# Patient Record
Sex: Male | Born: 1950 | Race: White | Hispanic: No | Marital: Married | Smoking: Never smoker
Health system: Southern US, Community
[De-identification: ages and names within clinical notes are randomized; demographics above are authoritative.]

## PROBLEM LIST (undated history)

## (undated) DIAGNOSIS — M1990 Unspecified osteoarthritis, unspecified site: Secondary | ICD-10-CM

## (undated) DIAGNOSIS — R011 Cardiac murmur, unspecified: Secondary | ICD-10-CM

## (undated) DIAGNOSIS — K219 Gastro-esophageal reflux disease without esophagitis: Secondary | ICD-10-CM

## (undated) DIAGNOSIS — R519 Headache, unspecified: Secondary | ICD-10-CM

## (undated) HISTORY — PX: COLONOSCOPY: SHX174

## (undated) HISTORY — PX: TONSILLECTOMY: SUR1361

## (undated) HISTORY — PX: APPENDECTOMY: SHX54

## (undated) HISTORY — PX: SMALL BOWEL REPAIR: SHX6447

---

## 2011-12-14 ENCOUNTER — Ambulatory Visit: Payer: Self-pay | Admitting: Physician Assistant

## 2016-03-24 ENCOUNTER — Other Ambulatory Visit: Payer: Self-pay | Admitting: Gastroenterology

## 2016-03-24 DIAGNOSIS — R1310 Dysphagia, unspecified: Secondary | ICD-10-CM

## 2016-03-26 ENCOUNTER — Ambulatory Visit
Admission: RE | Admit: 2016-03-26 | Discharge: 2016-03-26 | Disposition: A | Discharge status: Home / Self Care | Payer: 59 | Source: Ambulatory Visit | Attending: Gastroenterology | Admitting: Gastroenterology

## 2016-03-26 DIAGNOSIS — R1310 Dysphagia, unspecified: Secondary | ICD-10-CM | POA: Diagnosis not present

## 2016-03-26 DIAGNOSIS — K449 Diaphragmatic hernia without obstruction or gangrene: Secondary | ICD-10-CM | POA: Diagnosis not present

## 2021-01-23 ENCOUNTER — Other Ambulatory Visit: Payer: Self-pay | Admitting: Podiatry

## 2021-01-23 ENCOUNTER — Other Ambulatory Visit (HOSPITAL_COMMUNITY): Payer: Self-pay | Admitting: Podiatry

## 2021-01-23 DIAGNOSIS — M722 Plantar fascial fibromatosis: Secondary | ICD-10-CM

## 2021-02-05 ENCOUNTER — Other Ambulatory Visit: Payer: Self-pay

## 2021-02-05 ENCOUNTER — Ambulatory Visit
Admission: RE | Admit: 2021-02-05 | Discharge: 2021-02-05 | Disposition: A | Discharge status: Home / Self Care | Payer: Medicare HMO | Source: Ambulatory Visit | Attending: Podiatry | Admitting: Podiatry

## 2021-02-05 DIAGNOSIS — M722 Plantar fascial fibromatosis: Secondary | ICD-10-CM | POA: Insufficient documentation

## 2021-03-20 ENCOUNTER — Encounter: Payer: Self-pay | Admitting: Podiatry

## 2021-03-28 ENCOUNTER — Other Ambulatory Visit: Payer: Self-pay | Admitting: Podiatry

## 2021-03-29 NOTE — Discharge Instructions (Addendum)
[place]  POST OPERATIVE INSTRUCTIONS FOR DR. Vickki Muff AND DR. Garber   Take your medication as prescribed.  Pain medication should be taken only as needed.  Keep the dressing clean, dry and intact.  Maintain nonweightbearing to the left foot at all times for the next 2 weeks.  Try to keep the boot on at all times as it will be acting as a cast or splint.  If it feels too tight you may loosen it or apply pads to take pressure off tight areas.  Keep your foot elevated above the heart level for the first 48 hours.  Continue elevate thereafter to improve swelling.  May use ice on foot for max 10 minutes out of very 1 hour as needed.  Walking to the bathroom and brief periods of walking are acceptable, unless we have instructed you to be non-weight bearing.  Always wear your post-op shoe when walking.  Always use your crutches, knee scooter, or wheel chair if you are to be non-weight bearing.  Do not take a shower. Baths are permissible as long as the foot is kept out of the water.   Every hour you are awake:  Bend your knee 15 times. Massage calf 15 times  Call [facility] [phone]) if any of the following problems occur: You develop a temperature or fever. The bandage becomes saturated with blood. Medication does not stop your pain. Injury of the foot occurs. Any symptoms of infection including redness, odor, or red streaks running from wound.

## 2021-04-04 ENCOUNTER — Ambulatory Visit
Admission: RE | Admit: 2021-04-04 | Discharge: 2021-04-04 | Disposition: A | Discharge status: Home / Self Care | Payer: Medicare HMO | Attending: Podiatry | Admitting: Podiatry

## 2021-04-04 ENCOUNTER — Encounter
Admission: RE | Disposition: A | Discharge status: Home / Self Care | Payer: Self-pay | Source: Home / Self Care | Attending: Podiatry

## 2021-04-04 ENCOUNTER — Ambulatory Visit: Payer: Medicare HMO | Admitting: Anesthesiology

## 2021-04-04 ENCOUNTER — Other Ambulatory Visit: Payer: Self-pay

## 2021-04-04 ENCOUNTER — Encounter: Payer: Self-pay | Admitting: Podiatry

## 2021-04-04 DIAGNOSIS — M7732 Calcaneal spur, left foot: Secondary | ICD-10-CM | POA: Diagnosis not present

## 2021-04-04 DIAGNOSIS — M722 Plantar fascial fibromatosis: Secondary | ICD-10-CM | POA: Insufficient documentation

## 2021-04-04 DIAGNOSIS — K219 Gastro-esophageal reflux disease without esophagitis: Secondary | ICD-10-CM | POA: Insufficient documentation

## 2021-04-04 DIAGNOSIS — E785 Hyperlipidemia, unspecified: Secondary | ICD-10-CM | POA: Insufficient documentation

## 2021-04-04 DIAGNOSIS — Z09 Encounter for follow-up examination after completed treatment for conditions other than malignant neoplasm: Secondary | ICD-10-CM

## 2021-04-04 DIAGNOSIS — M1990 Unspecified osteoarthritis, unspecified site: Secondary | ICD-10-CM | POA: Insufficient documentation

## 2021-04-04 HISTORY — PX: CALCANEAL OSTEOTOMY: SHX1281

## 2021-04-04 HISTORY — PX: PLANTAR FASCIA RELEASE: SHX2239

## 2021-04-04 HISTORY — DX: Headache, unspecified: R51.9

## 2021-04-04 HISTORY — DX: Gastro-esophageal reflux disease without esophagitis: K21.9

## 2021-04-04 HISTORY — DX: Cardiac murmur, unspecified: R01.1

## 2021-04-04 HISTORY — DX: Unspecified osteoarthritis, unspecified site: M19.90

## 2021-04-04 SURGERY — RELEASE, FASCIA, PLANTAR
Anesthesia: General | Laterality: Left

## 2021-04-04 MED ORDER — BUPIVACAINE HCL 0.5 % IJ SOLN
INTRAMUSCULAR | Status: DC | PRN
  Administered 2021-04-04: 10 mL

## 2021-04-04 MED ORDER — OXYCODONE HCL 5 MG/5ML PO SOLN: 5.0000 mg | Freq: Once | ORAL | Status: DC | PRN

## 2021-04-04 MED ORDER — LIDOCAINE HCL (CARDIAC) PF 100 MG/5ML IV SOSY
PREFILLED_SYRINGE | INTRAVENOUS | Status: DC | PRN
  Administered 2021-04-04: 50 mg via INTRAVENOUS

## 2021-04-04 MED ORDER — ASPIRIN EC 81 MG PO TBEC: 81.0000 mg | DELAYED_RELEASE_TABLET | Freq: Two times a day (BID) | ORAL | 0 refills | Status: AC

## 2021-04-04 MED ORDER — MIDAZOLAM HCL 2 MG/2ML IJ SOLN
INTRAMUSCULAR | Status: DC | PRN
  Administered 2021-04-04: 2 mg via INTRAVENOUS

## 2021-04-04 MED ORDER — ACETAMINOPHEN 160 MG/5ML PO SOLN: 325.0000 mg | ORAL | Status: DC | PRN

## 2021-04-04 MED ORDER — PROPOFOL 500 MG/50ML IV EMUL
INTRAVENOUS | Status: DC | PRN
Start: 2021-04-04 — End: 2021-04-04
  Administered 2021-04-04: 100 ug/kg/min via INTRAVENOUS

## 2021-04-04 MED ORDER — HYDROCODONE-ACETAMINOPHEN 5-325 MG PO TABS: 1.0000 | ORAL_TABLET | Freq: Four times a day (QID) | ORAL | 0 refills | Status: AC | PRN

## 2021-04-04 MED ORDER — LACTATED RINGERS IV SOLN: INTRAVENOUS | Status: DC

## 2021-04-04 MED ORDER — OXYCODONE HCL 5 MG PO TABS: 5.0000 mg | ORAL_TABLET | Freq: Once | ORAL | Status: DC | PRN

## 2021-04-04 MED ORDER — GLYCOPYRROLATE 0.2 MG/ML IJ SOLN
INTRAMUSCULAR | Status: DC | PRN
  Administered 2021-04-04: .2 mg via INTRAVENOUS

## 2021-04-04 MED ORDER — FENTANYL CITRATE PF 50 MCG/ML IJ SOSY: 25.0000 ug | PREFILLED_SYRINGE | INTRAMUSCULAR | Status: DC | PRN

## 2021-04-04 MED ORDER — BUPIVACAINE LIPOSOME 1.3 % IJ SUSP
INTRAMUSCULAR | Status: DC | PRN
  Administered 2021-04-04: 15 mL

## 2021-04-04 MED ORDER — 0.9 % SODIUM CHLORIDE (POUR BTL) OPTIME
TOPICAL | Status: DC | PRN
  Administered 2021-04-04: 1000 mL

## 2021-04-04 MED ORDER — FENTANYL CITRATE (PF) 100 MCG/2ML IJ SOLN
INTRAMUSCULAR | Status: DC | PRN
  Administered 2021-04-04 (×2): 50 ug via INTRAVENOUS

## 2021-04-04 MED ORDER — ONDANSETRON HCL 4 MG/2ML IJ SOLN
INTRAMUSCULAR | Status: DC | PRN
Start: 2021-04-04 — End: 2021-04-04
  Administered 2021-04-04: 4 mg via INTRAVENOUS

## 2021-04-04 MED ORDER — CEFAZOLIN SODIUM-DEXTROSE 2-4 GM/100ML-% IV SOLN
2.0000 g | INTRAVENOUS | Status: AC
  Administered 2021-04-04: 2 g via INTRAVENOUS

## 2021-04-04 MED ORDER — ACETAMINOPHEN 325 MG PO TABS: 325.0000 mg | ORAL_TABLET | ORAL | Status: DC | PRN

## 2021-04-04 SURGICAL SUPPLY — 47 items
BNDG CMPR STD VLCR NS LF 5.8X4 (GAUZE/BANDAGES/DRESSINGS) ×2
BNDG COHESIVE 4X5 TAN ST LF (GAUZE/BANDAGES/DRESSINGS) ×2 IMPLANT
BNDG ELASTIC 4X5.8 VLCR NS LF (GAUZE/BANDAGES/DRESSINGS) ×4 IMPLANT
BNDG ESMARK 4X12 TAN STRL LF (GAUZE/BANDAGES/DRESSINGS) ×2 IMPLANT
BNDG GAUZE ELAST 4 BULKY (GAUZE/BANDAGES/DRESSINGS) ×2 IMPLANT
CANISTER SUCT 1200ML W/VALVE (MISCELLANEOUS) ×2 IMPLANT
COVER LIGHT HANDLE UNIVERSAL (MISCELLANEOUS) ×4 IMPLANT
CUFF TOURN SGL QUICK 18X4 (TOURNIQUET CUFF) IMPLANT
CUFF TOURN SGL QUICK 24 (TOURNIQUET CUFF)
CUFF TRNQT CYL 24X4X16.5-23 (TOURNIQUET CUFF) IMPLANT
DURAPREP 26ML APPLICATOR (WOUND CARE) ×2 IMPLANT
ELECT REM PT RETURN 9FT ADLT (ELECTROSURGICAL) ×2
ELECTRODE REM PT RTRN 9FT ADLT (ELECTROSURGICAL) ×1 IMPLANT
GAUZE 4X4 16PLY ~~LOC~~+RFID DBL (SPONGE) ×1 IMPLANT
GAUZE SPONGE 4X4 12PLY STRL (GAUZE/BANDAGES/DRESSINGS) ×2 IMPLANT
GAUZE XEROFORM 1X8 LF (GAUZE/BANDAGES/DRESSINGS) ×2 IMPLANT
GAUZE XEROFORM 5X9 LF (GAUZE/BANDAGES/DRESSINGS) ×1 IMPLANT
GLOVE SRG 8 PF TXTR STRL LF DI (GLOVE) IMPLANT
GLOVE SURG ENC MOIS LTX SZ7 (GLOVE) ×2 IMPLANT
GLOVE SURG POLYISO LF SZ7 (GLOVE) ×4 IMPLANT
GLOVE SURG UNDER POLY LF SZ7 (GLOVE) ×2 IMPLANT
GLOVE SURG UNDER POLY LF SZ8 (GLOVE) ×4
GOWN STRL REUS W/ TWL LRG LVL3 (GOWN DISPOSABLE) ×2 IMPLANT
GOWN STRL REUS W/TWL LRG LVL3 (GOWN DISPOSABLE) ×4
IV NS 250ML (IV SOLUTION) ×2
IV NS 250ML BAXH (IV SOLUTION) ×1 IMPLANT
IV NS 500ML (IV SOLUTION)
IV NS 500ML BAXH (IV SOLUTION) IMPLANT
K-WIRE DBL END TROCAR 6X.062 (WIRE) ×2
KIT CARPAL TUNNEL (MISCELLANEOUS) ×4
KIT ESCP INSRT D SLOT CANN KN (MISCELLANEOUS) ×1 IMPLANT
KIT PRC PRB RTRGD 3ANG KNF HND (MISCELLANEOUS) ×1 IMPLANT
KIT TURNOVER KIT A (KITS) ×2 IMPLANT
KWIRE DBL END TROCAR 6X.062 (WIRE) IMPLANT
NS IRRIG 500ML POUR BTL (IV SOLUTION) ×2 IMPLANT
PACK EXTREMITY ARMC (MISCELLANEOUS) ×2 IMPLANT
PAD ABD DERMACEA PRESS 5X9 (GAUZE/BANDAGES/DRESSINGS) ×1 IMPLANT
PADDING CAST BLEND 4X4 NS (MISCELLANEOUS) ×6 IMPLANT
SPLINT CAST 1 STEP 4X30 (MISCELLANEOUS) ×2 IMPLANT
STOCKINETTE IMPERVIOUS LG (DRAPES) ×2 IMPLANT
SUT ETHILON 3-0 (SUTURE) ×2 IMPLANT
SUT ETHILON 3-0 FS-10 30 BLK (SUTURE) ×2
SUT VIC AB 3-0 SH 27 (SUTURE)
SUT VIC AB 3-0 SH 27X BRD (SUTURE) IMPLANT
SUTURE EHLN 3-0 FS-10 30 BLK (SUTURE) IMPLANT
WAND TENDON TOPAZ 0 ANGL (MISCELLANEOUS) IMPLANT
WAND TOPAZ MICRO DEBRIDER (MISCELLANEOUS) ×1 IMPLANT

## 2021-04-04 NOTE — Transfer of Care (Signed)
Immediate Anesthesia Transfer of Care Note  Patient: Billy Tran  Procedure(s) Performed: ENDOSCOPIC PLANTAR FASCIA RELEASE (Left) PARTIAL CALCANECTOMY (Left)  Patient Location: PACU  Anesthesia Type: General  Level of Consciousness: awake, alert  and patient cooperative  Airway and Oxygen Therapy: Patient Spontanous Breathing and Patient connected to supplemental oxygen  Post-op Assessment: Post-op Vital signs reviewed, Patient's Cardiovascular Status Stable, Respiratory Function Stable, Patent Airway and No signs of Nausea or vomiting  Post-op Vital Signs: Reviewed and stable  Complications: No notable events documented.

## 2021-04-04 NOTE — Op Note (Signed)
PODIATRY / FOOT AND ANKLE SURGERY OPERATIVE REPORT    SURGEON: Caroline More, DPM  PRE-OPERATIVE DIAGNOSIS:  1.  Left plantar fasciitis with plantar heel spur  POST-OPERATIVE DIAGNOSIS: Same  PROCEDURE(S): Left endoscopic plantar fasciotomy  Left Topaz micro debridement of plantar fascia Left resection of plantar calcaneal heel spur  HEMOSTASIS: Left ankle tourniquet  ANESTHESIA: MAC  ESTIMATED BLOOD LOSS: 10 cc  FINDING(S): 1.  Large plantar heel spur left foot at the origin of plantar fascia 2.  Increased thickness to the plantar fascial left foot  PATHOLOGY/SPECIMEN(S): None  INDICATIONS:   Billy Tran is a 71 y.o. male who presents with chronic pain to the left heel plantarly.  Patient had x-rays taken which also showed a plantar heel spur.  Patient subsequently underwent conservative care consisting of change in shoe gear, orthoses, steroid injections, partial weightbearing techniques with booting, strapping, taping, padding, oral anti-inflammatory medications along with physical therapy types of exercises but still continued to have pain discomfort.  An MRI was taken which showed increased thickness of the plantar fascial as well as a small plantar heel spur with some mild marrow edema at the level of the heel spur.  Discussed all treatment options with patient both conservative and surgical attempts at correction include potential risks and complications at this time patient has elected for surgical intervention consisting of left endoscopic plantar fasciotomy with Topaz micro debridement and plantar calcaneal heel spur resection.  No guarantees given.  Consent obtained and placed in patient's chart.  DESCRIPTION: After obtaining full informed written consent, the patient was brought back to the operating room and placed supine upon the operating table.  The patient received IV antibiotics prior to induction.  After obtaining adequate anesthesia, the patient was prepped and  draped in the standard fashion.  A preoperative block was performed 10 cc of half percent Marcaine plain about the medial and lateral heel as well as tibial nerve distribution around the tarsal tunnel.  An Esmarch bandage was used to exsanguinate the left lower extremity and the pneumatic ankle tourniquet was inflated.  Attention was directed to the left medial heel where a linear longitudinal incision was made slightly distal to the origin point of the plantar fascia parallel to the plantar fascia parallel to the long axis of the foot.  The incision was deepened to the subcutaneous tissues utilizing sharp and blunt dissection care was taken to identify and retract all vital neurovascular structures no venous contributories were cauterized as necessary.  Blunt probing was then performed in the fatty tissue at the plantar heel/arch area creating a pocket between the subcutaneous fat and the plantar fascia.  Once this interval was obtained and obturator and cannula was then placed into the area with the appropriate orientation from medial to lateral.  A small percutaneous incision was made at the lateral heel to past the obturator and cannula through the lateral heel.  The obturator was removed and cannula was left in place.  At this time cotton tip applicators were placed through the cannula to clean out the surgical field.  The scope was then introduced further through the lateral heel incision directed from lateral to medial.  The hook blade was then held external to the foot and approximately 1/2-2/3 of the plantar fascia was measured out of the central band and all the medial band was measured out on the hook blade.  At this time the hook blade was then introduced through the medial heel incision under the scope.  The central band and medial band of the plantar fascia was released such that 2/3-1/2 of the central band was released and all the medial band.  Once this was performed the flexor digitorum brevis  muscle belly was able to be identified indicating successful release.  The foot was held in a maximally dorsiflexed position along with a hallux maximally dorsiflexed to increase the ease of passing of the instrumentation.  A flush was performed with copious amounts normal sterile saline.  The obturator was then placed back in and the obturator and cannula was removed.  At this time under fluoroscopic guidance blunt dissection was performed to the plantar heel spur.  Once the plantar heel spur was identified the bone rasp was then placed through the medial heel incision directly on the heel spur and the plantar medial heel spur was resected under fluoroscopic guidance.  A flush was performed with copious amounts normal sterile saline.  Final C-arm imaging was then taken showing resection of the plantar heel spur.  The medial and lateral heel incisions were then reapproximated well coapted with 3-0 nylon in a combination of simple and horizontal mattress type stitching.  Attention was then directed to the plantar aspect of the heel where a 5 x 5 grid was made on the plantar surface of the heel around the origin of the plantar fascia and on the plantar surface of the heel.  A 0.062 K wire was then used to puncture the small grade holes.  The Topaz micro debridement instrument was then passed into each hole and directed in 3 separate directions to microdebrider the plantar fascial further.  An additional 15 cc of Exparel was injected about the operative sites.  The pneumatic ankle tourniquet was released and a prompt hyperemic response was noted all digits left foot.  A postoperative dressing was then applied consisting of Xeroform followed by 4 x 4 gauze, ABD, Kerlix, Ace wrap, cam boot tall.  Patient tolerated the procedure and anesthesia well was transferred to recovery room vital signs stable vascular status intact all toes left foot.  Patient be discharged home with the appropriate orders and follow-up  instructions as well as medications.  Patient is to remain nonweightbearing to the left foot at all times for the next 2 weeks.  Discussed with family postoperatively.  Patient to follow-up 1 week after surgical date next week.  COMPLICATIONS: None  CONDITION: Good, stable  Caroline More, DPM

## 2021-04-04 NOTE — H&P (Signed)
HISTORY AND PHYSICAL INTERVAL NOTE:  04/04/2021  9:30 AM  Billy Tran  has presented today for surgery, with the diagnosis of M72.2 - Plantar fasciitis, left M77.32 - Calcaneal spur, left M79.672 - Acute foot pain, left.  The various methods of treatment have been discussed with the patient.  No guarantees were given.  After consideration of risks, benefits and other options for treatment, the patient has consented to surgery.  I have reviewed the patients chart and labs.    PROCEDURE: ALL LEFT FOOT Left endoscopic plantar fasciotomy Left topaz microdebridement of plantar fascia Left resection of heel spur  A history and physical examination was performed in my office.  The patient was reexamined.  There have been no changes to this history and physical examination.  Rosetta Posner, DPM

## 2021-04-04 NOTE — Anesthesia Procedure Notes (Signed)
Procedure Name: General with mask airway Date/Time: 04/04/2021 10:42 AM Performed by: Jinny Blossom, CRNA Pre-anesthesia Checklist: Timeout performed, Patient being monitored, Suction available, Emergency Drugs available and Patient identified Patient Re-evaluated:Patient Re-evaluated prior to induction Oxygen Delivery Method: Simple face mask Preoxygenation: Pre-oxygenation with 100% oxygen

## 2021-04-04 NOTE — Anesthesia Postprocedure Evaluation (Signed)
Anesthesia Post Note  Patient: Billy Tran  Procedure(s) Performed: ENDOSCOPIC PLANTAR FASCIA RELEASE (Left) PARTIAL CALCANECTOMY (Left)     Patient location during evaluation: PACU Anesthesia Type: General Level of consciousness: awake Pain management: pain level controlled Vital Signs Assessment: post-procedure vital signs reviewed and stable Respiratory status: respiratory function stable Cardiovascular status: stable Postop Assessment: no signs of nausea or vomiting Anesthetic complications: no   No notable events documented.  Jola Babinski

## 2021-04-04 NOTE — Anesthesia Preprocedure Evaluation (Signed)
Anesthesia Evaluation  Patient identified by MRN, date of birth, ID band Patient awake    Reviewed: Allergy & Precautions, NPO status   Airway Mallampati: II  TM Distance: >3 FB     Dental   Pulmonary    Pulmonary exam normal        Cardiovascular  Rhythm:Regular Rate:Normal  HLD   Neuro/Psych  Headaches,    GI/Hepatic GERD  ,  Endo/Other    Renal/GU      Musculoskeletal  (+) Arthritis ,   Abdominal   Peds  Hematology   Anesthesia Other Findings   Reproductive/Obstetrics                             Anesthesia Physical Anesthesia Plan  ASA: 2  Anesthesia Plan: General   Post-op Pain Management:    Induction: Intravenous  PONV Risk Score and Plan: 2 and Propofol infusion, TIVA and Treatment may vary due to age or medical condition  Airway Management Planned: Natural Airway and Nasal Cannula  Additional Equipment:   Intra-op Plan:   Post-operative Plan:   Informed Consent: I have reviewed the patients History and Physical, chart, labs and discussed the procedure including the risks, benefits and alternatives for the proposed anesthesia with the patient or authorized representative who has indicated his/her understanding and acceptance.       Plan Discussed with: CRNA  Anesthesia Plan Comments:         Anesthesia Quick Evaluation

## 2021-04-05 ENCOUNTER — Encounter: Payer: Self-pay | Admitting: Podiatry

## 2022-01-22 IMAGING — MR MR ANKLE*L* W/O CM
5 series · 40 of 40 positions shown · non-contrast
Comparison: None.

CLINICAL DATA: Heel spur.  Concern for plantar fasciitis.

EXAM:
MRI OF THE LEFT ANKLE WITHOUT CONTRAST
TECHNIQUE: Multiplanar, multisequence MR imaging of the ankle was performed. No
intravenous contrast was administered.

[Series 3: PD fat-sat · axial · 3.0mm · 0.70mm/px · z∈[-108,+34]mm · 9 of 44 slices shown]
[im 1/44]
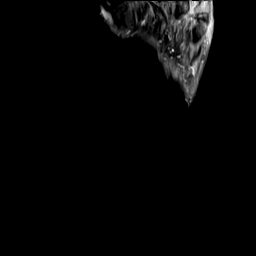
[im 6/44]
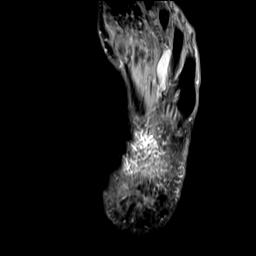
[im 11/44]
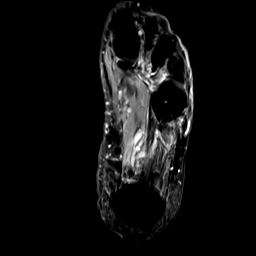
[im 17/44]
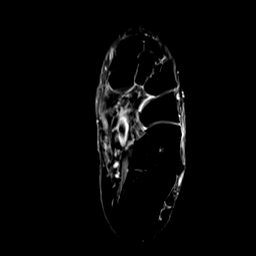
[im 22/44]
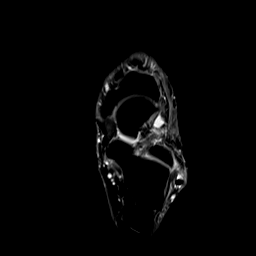
[im 27/44]
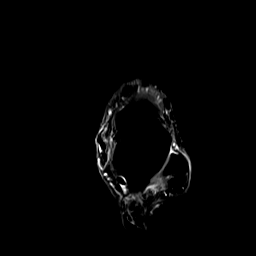
[im 33/44]
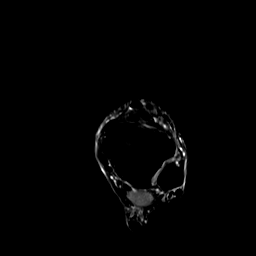
[im 38/44]
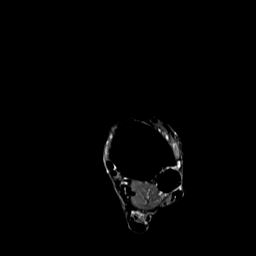
[im 44/44]
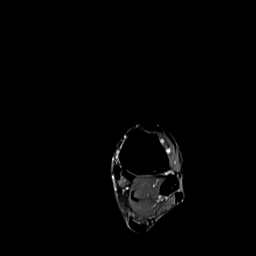

[Series 5: T2 fat-sat · coronal · 3.0mm · 0.70mm/px · 10 of 47 slices shown (1 of 2)]
[im 1/47]
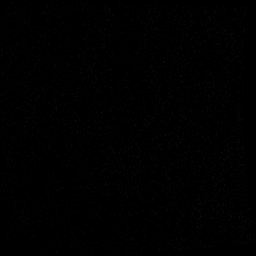
[im 6/47]
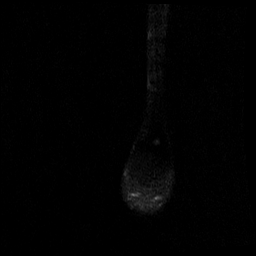
[im 11/47]
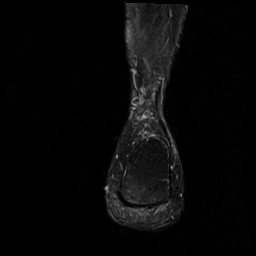
[im 16/47]
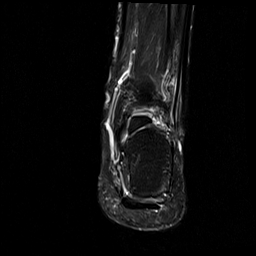
[im 21/47]
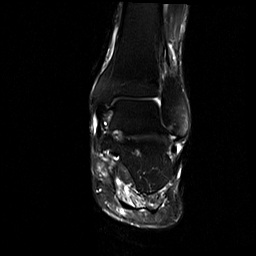
[im 26/47]
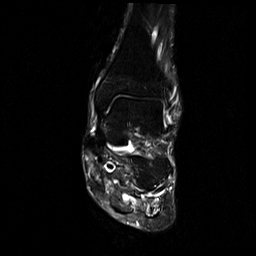
[im 31/47]
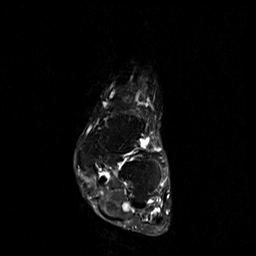
[im 36/47]
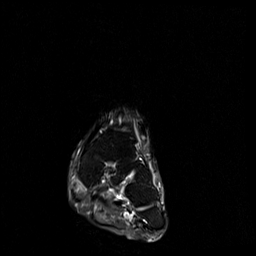
[im 41/47]
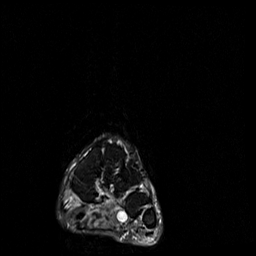
[im 47/47]
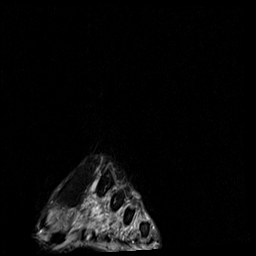

[Series 6: T1 · sagittal · 3.0mm · 0.56mm/px · 6 of 29 slices shown]
[im 1/29]
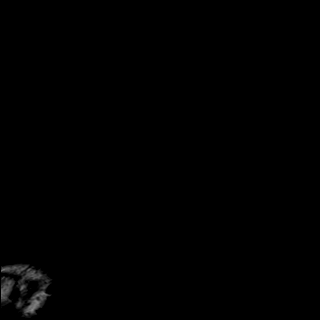
[im 6/29]
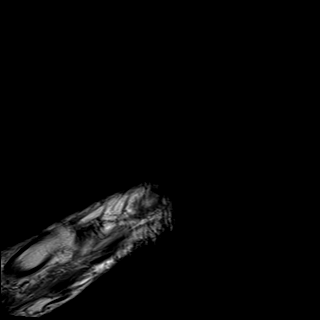
[im 12/29]
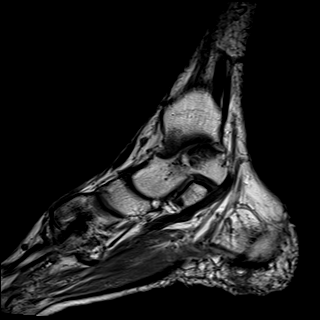
[im 17/29]
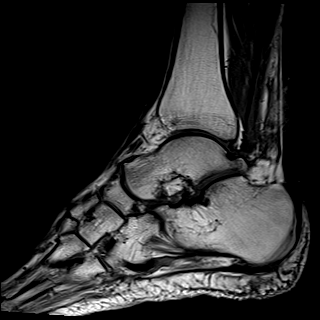
[im 23/29]
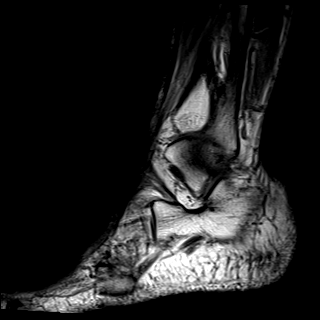
[im 29/29]
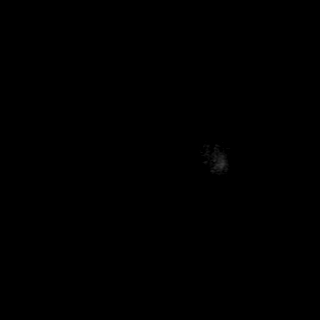

[Series 7: STIR · sagittal · 3.0mm · 0.70mm/px · 6 of 29 slices shown]
[im 1/29]
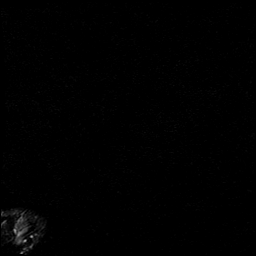
[im 6/29]
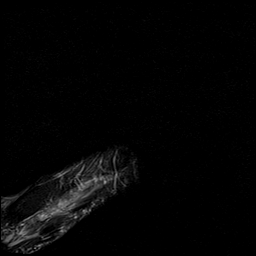
[im 12/29]
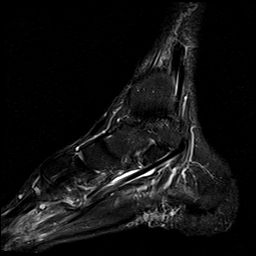
[im 17/29]
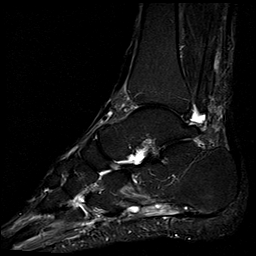
[im 23/29]
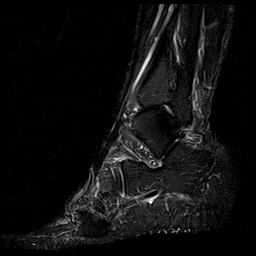
[im 29/29]
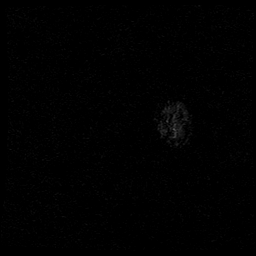

[Series 8: T2 fat-sat · axial · 3.0mm · 0.70mm/px · z∈[-108,+34]mm · 9 of 44 slices shown (2 of 2)]
[im 1/44]
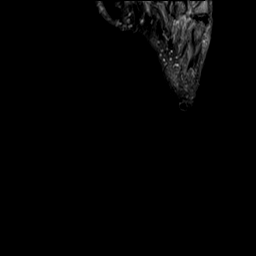
[im 6/44]
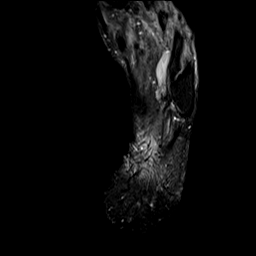
[im 11/44]
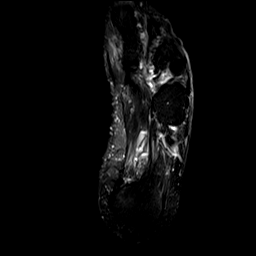
[im 17/44]
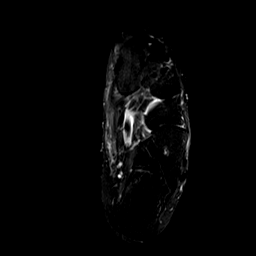
[im 22/44]
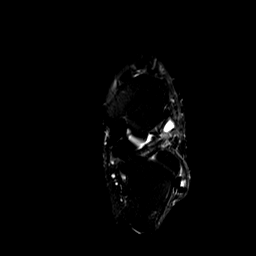
[im 27/44]
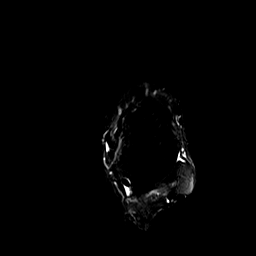
[im 33/44]
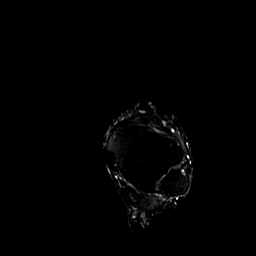
[im 38/44]
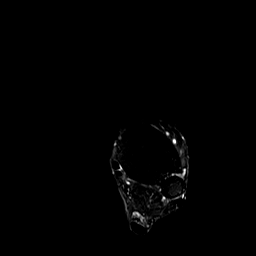
[im 44/44]
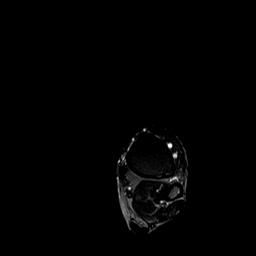

[40 of 40 positions shown; findings below may reference images not displayed]

FINDINGS: TENDONS

Peroneal: Intact peroneus longus and peroneus brevis tendons.

Posteromedial: Intact tibialis posterior, flexor hallucis longus and
flexor digitorum longus tendons. Small volume tenosynovial fluid
associated with the tibialis posterior tendon as well as the FHL
tendon both posteriorly and at the level of the knot of Henry.

Anterior: Intact tibialis anterior, extensor hallucis longus and
extensor digitorum longus tendons.

Achilles: Intact.

Plantar Fascia: Thickening of the central band of the plantar fascia
with associated perifascial edema. No tear. Mild subcortical marrow
edema within the plantar aspect of the calcaneus.

LIGAMENTS

Lateral: The anterior and posterior tibiofibular ligaments are
intact. The anterior and posterior talofibular ligaments are intact.
Intact calcaneofibular ligament.

Medial: Deltoid ligament and spring ligament complex intact.

CARTILAGE

Ankle Joint: No joint effusion or chondral defect.

Subtalar Joints/Sinus Tarsi: No joint effusion or chondral defect.
Preservation of the anatomic fat within the sinus tarsi.

Bones: Tiny plantar calcaneal spur with mild marrow edema at the
plantar fascia enthesis. No fracture. No malalignment. No additional
sites of marrow edema. No suspicious bone lesion.

Other: None.
IMPRESSION: 1. Plantar fasciitis without tear.
2. Small volume tenosynovial fluid associated with the tibialis
posterior and FHL tendon sheaths.

## 2022-04-14 HISTORY — PX: BLEPHAROPLASTY: SUR158

## 2022-05-05 NOTE — H&P (Signed)
Subjective:     Patient ID: Billy Tran is a 72 y.o. male.   HPI   3 w post lower eyelid blepharoplasty. Since last visit, completed oral steroids for treatment left lower eyelid malposition and continues taping lids shut at night and as during day. Also used patch over left. Doing lower lid massage.   Review of Systems      Objective:   Physical Exam Cardiovascular:     Rate and Rhythm: Normal rate and regular rhythm.     Heart sounds: Normal heart sounds.  Pulmonary:     Effort: Pulmonary effort is normal.     Breath sounds: Normal breath sounds.     HEENT scars maturing, left lower eyelid not adherent to globe Exposure kerato conjunctivitis present Able to passively correct      Assessment:     Left lower eyelid ectropion    Plan:     No significant change since last exam. Plan OR for left lateral canthopexy, tarsorrhaphy. Reviewed incision, OP surgery. Reviewed risk continued lower eyelid malposition, permanent dry eyes. Pictures taken.Marland Kitchen

## 2022-05-06 ENCOUNTER — Other Ambulatory Visit: Payer: Self-pay

## 2022-05-06 ENCOUNTER — Encounter (HOSPITAL_BASED_OUTPATIENT_CLINIC_OR_DEPARTMENT_OTHER): Payer: Self-pay | Admitting: Plastic Surgery

## 2022-05-09 ENCOUNTER — Encounter (HOSPITAL_BASED_OUTPATIENT_CLINIC_OR_DEPARTMENT_OTHER): Payer: Self-pay | Admitting: Plastic Surgery

## 2022-05-09 ENCOUNTER — Ambulatory Visit (HOSPITAL_BASED_OUTPATIENT_CLINIC_OR_DEPARTMENT_OTHER): Payer: Medicare HMO | Admitting: Anesthesiology

## 2022-05-09 ENCOUNTER — Encounter (HOSPITAL_BASED_OUTPATIENT_CLINIC_OR_DEPARTMENT_OTHER)
Admission: RE | Disposition: A | Discharge status: Home / Self Care | Payer: Self-pay | Source: Home / Self Care | Attending: Plastic Surgery

## 2022-05-09 ENCOUNTER — Other Ambulatory Visit: Payer: Self-pay

## 2022-05-09 ENCOUNTER — Ambulatory Visit (HOSPITAL_BASED_OUTPATIENT_CLINIC_OR_DEPARTMENT_OTHER)
Admission: RE | Admit: 2022-05-09 | Discharge: 2022-05-09 | Disposition: A | Discharge status: Home / Self Care | Payer: Medicare HMO | Attending: Plastic Surgery | Admitting: Plastic Surgery

## 2022-05-09 DIAGNOSIS — H02105 Unspecified ectropion of left lower eyelid: Secondary | ICD-10-CM | POA: Diagnosis present

## 2022-05-09 HISTORY — PX: CANTHOPLASTY: SHX1286

## 2022-05-09 SURGERY — CANTHOPLASTY
Anesthesia: General | Site: Eye | Laterality: Left

## 2022-05-09 MED ORDER — LIDOCAINE 2% (20 MG/ML) 5 ML SYRINGE
INTRAMUSCULAR | Status: AC
  Filled 2022-05-09: qty 5

## 2022-05-09 MED ORDER — FENTANYL CITRATE (PF) 100 MCG/2ML IJ SOLN: 25.0000 ug | INTRAMUSCULAR | Status: DC | PRN

## 2022-05-09 MED ORDER — CEFAZOLIN SODIUM-DEXTROSE 2-4 GM/100ML-% IV SOLN
INTRAVENOUS | Status: AC
  Filled 2022-05-09: qty 100

## 2022-05-09 MED ORDER — LACTATED RINGERS IV SOLN: INTRAVENOUS | Status: DC

## 2022-05-09 MED ORDER — FENTANYL CITRATE (PF) 100 MCG/2ML IJ SOLN
INTRAMUSCULAR | Status: DC | PRN
  Administered 2022-05-09: 50 ug via INTRAVENOUS

## 2022-05-09 MED ORDER — MIDAZOLAM HCL 5 MG/5ML IJ SOLN
INTRAMUSCULAR | Status: DC | PRN
  Administered 2022-05-09: 1 mg via INTRAVENOUS

## 2022-05-09 MED ORDER — MIDAZOLAM HCL 2 MG/2ML IJ SOLN
INTRAMUSCULAR | Status: AC
  Filled 2022-05-09: qty 2

## 2022-05-09 MED ORDER — OXYCODONE HCL 5 MG/5ML PO SOLN: 5.0000 mg | Freq: Once | ORAL | Status: DC | PRN

## 2022-05-09 MED ORDER — PHENYLEPHRINE 80 MCG/ML (10ML) SYRINGE FOR IV PUSH (FOR BLOOD PRESSURE SUPPORT)
PREFILLED_SYRINGE | INTRAVENOUS | Status: AC
  Filled 2022-05-09: qty 10

## 2022-05-09 MED ORDER — ATROPINE SULFATE 0.4 MG/ML IV SOLN
INTRAVENOUS | Status: AC
  Filled 2022-05-09: qty 1

## 2022-05-09 MED ORDER — LIDOCAINE-EPINEPHRINE 1 %-1:100000 IJ SOLN
INTRAMUSCULAR | Status: DC | PRN
  Administered 2022-05-09: 2 mL

## 2022-05-09 MED ORDER — LIDOCAINE 2% (20 MG/ML) 5 ML SYRINGE
INTRAMUSCULAR | Status: DC | PRN
  Administered 2022-05-09: 50 mg via INTRAVENOUS

## 2022-05-09 MED ORDER — SUCCINYLCHOLINE CHLORIDE 200 MG/10ML IV SOSY
PREFILLED_SYRINGE | INTRAVENOUS | Status: AC
  Filled 2022-05-09: qty 10

## 2022-05-09 MED ORDER — OXYCODONE HCL 5 MG PO TABS: 5.0000 mg | ORAL_TABLET | Freq: Once | ORAL | Status: DC | PRN

## 2022-05-09 MED ORDER — FENTANYL CITRATE (PF) 100 MCG/2ML IJ SOLN
INTRAMUSCULAR | Status: AC
  Filled 2022-05-09: qty 2

## 2022-05-09 MED ORDER — DEXAMETHASONE SODIUM PHOSPHATE 4 MG/ML IJ SOLN
INTRAMUSCULAR | Status: DC | PRN
  Administered 2022-05-09: 4 mg via INTRAVENOUS

## 2022-05-09 MED ORDER — EPHEDRINE 5 MG/ML INJ
INTRAVENOUS | Status: AC
  Filled 2022-05-09: qty 5

## 2022-05-09 MED ORDER — CEFAZOLIN SODIUM-DEXTROSE 2-4 GM/100ML-% IV SOLN
2.0000 g | INTRAVENOUS | Status: AC
  Administered 2022-05-09: 2 g via INTRAVENOUS

## 2022-05-09 MED ORDER — ONDANSETRON HCL 4 MG/2ML IJ SOLN: 4.0000 mg | Freq: Once | INTRAMUSCULAR | Status: DC | PRN

## 2022-05-09 MED ORDER — ONDANSETRON HCL 4 MG/2ML IJ SOLN
INTRAMUSCULAR | Status: AC
  Filled 2022-05-09: qty 2

## 2022-05-09 MED ORDER — PROPOFOL 10 MG/ML IV BOLUS
INTRAVENOUS | Status: DC | PRN
  Administered 2022-05-09: 100 mg via INTRAVENOUS

## 2022-05-09 MED ORDER — DEXAMETHASONE SODIUM PHOSPHATE 10 MG/ML IJ SOLN
INTRAMUSCULAR | Status: AC
  Filled 2022-05-09: qty 1

## 2022-05-09 MED ORDER — BSS IO SOLN
INTRAOCULAR | Status: AC
  Filled 2022-05-09: qty 15

## 2022-05-09 SURGICAL SUPPLY — 47 items
ADH SKN CLS APL DERMABOND .7 (GAUZE/BANDAGES/DRESSINGS)
APL SRG 3 HI ABS STRL LF PLS (MISCELLANEOUS)
APL SWBSTK 6 STRL LF DISP (MISCELLANEOUS) ×1
APPLICATOR COTTON TIP 6 STRL (MISCELLANEOUS) IMPLANT
APPLICATOR COTTON TIP 6IN STRL (MISCELLANEOUS) ×1
APPLICATOR DR MATTHEWS STRL (MISCELLANEOUS) IMPLANT
BLADE SURG 15 STRL LF DISP TIS (BLADE) ×1 IMPLANT
BLADE SURG 15 STRL SS (BLADE) ×1
BNDG EYE OVAL 2 1/8 X 2 5/8 (GAUZE/BANDAGES/DRESSINGS) IMPLANT
CORD BIPOLAR FORCEPS 12FT (ELECTRODE) IMPLANT
COVER BACK TABLE 60X90IN (DRAPES) ×1 IMPLANT
COVER MAYO STAND STRL (DRAPES) ×1 IMPLANT
DERMABOND ADVANCED .7 DNX12 (GAUZE/BANDAGES/DRESSINGS) IMPLANT
DRAPE U-SHAPE 76X120 STRL (DRAPES) ×1 IMPLANT
DRAPE UTILITY XL STRL (DRAPES) ×1 IMPLANT
ELECT NDL BLADE 2-5/6 (NEEDLE) ×1 IMPLANT
ELECT NEEDLE BLADE 2-5/6 (NEEDLE) ×1 IMPLANT
ELECT REM PT RETURN 9FT ADLT (ELECTROSURGICAL) ×1
ELECTRODE REM PT RTRN 9FT ADLT (ELECTROSURGICAL) ×1 IMPLANT
GLOVE BIO SURGEON STRL SZ 6 (GLOVE) ×1 IMPLANT
GLOVE BIO SURGEON STRL SZ 6.5 (GLOVE) IMPLANT
GLOVE BIOGEL PI IND STRL 6.5 (GLOVE) IMPLANT
GOWN STRL REUS W/ TWL LRG LVL3 (GOWN DISPOSABLE) ×2 IMPLANT
GOWN STRL REUS W/TWL LRG LVL3 (GOWN DISPOSABLE) ×2
NDL HYPO 27GX1-1/4 (NEEDLE) IMPLANT
NDL HYPO 30GX1 BEV (NEEDLE) ×1 IMPLANT
NEEDLE HYPO 27GX1-1/4 (NEEDLE) IMPLANT
NEEDLE HYPO 30GX1 BEV (NEEDLE) ×1 IMPLANT
PACK BASIN DAY SURGERY FS (CUSTOM PROCEDURE TRAY) ×1 IMPLANT
PENCIL SMOKE EVACUATOR (MISCELLANEOUS) ×1 IMPLANT
SHEILD EYE MED CORNL SHD 22X21 (OPHTHALMIC RELATED) ×2
SHIELD EYE MED CORNL SHD 22X21 (OPHTHALMIC RELATED) ×1 IMPLANT
SLEEVE SCD COMPRESS KNEE MED (STOCKING) ×1 IMPLANT
SPIKE FLUID TRANSFER (MISCELLANEOUS) IMPLANT
STAPLER VISISTAT 35W (STAPLE) ×1 IMPLANT
STRIP CLOSURE SKIN 1/2X4 (GAUZE/BANDAGES/DRESSINGS) IMPLANT
SUT MERSILENE 4 0 P 3 (SUTURE) IMPLANT
SUT MNCRL 6-0 UNDY P1 1X18 (SUTURE) IMPLANT
SUT MONOCRYL 6-0 P1 1X18 (SUTURE)
SUT PLAIN 5 0 P 3 18 (SUTURE) IMPLANT
SUT PROLENE 6 0 P 1 18 (SUTURE) IMPLANT
SUT SILK 4 0 P 3 (SUTURE) IMPLANT
SUT VIC AB 5-0 P-3 18X BRD (SUTURE) IMPLANT
SUT VIC AB 5-0 P3 18 (SUTURE)
SYR CONTROL 10ML LL (SYRINGE) ×1 IMPLANT
TOWEL GREEN STERILE FF (TOWEL DISPOSABLE) ×1 IMPLANT
TRAY DSU PREP LF (CUSTOM PROCEDURE TRAY) ×1 IMPLANT

## 2022-05-09 NOTE — Discharge Instructions (Signed)

## 2022-05-09 NOTE — Transfer of Care (Signed)
Immediate Anesthesia Transfer of Care Note  Patient: Billy Tran  Procedure(s) Performed: LEFT LATERAL CANTHOPLASTY (Left)  Patient Location: PACU  Anesthesia Type:General  Level of Consciousness: sedated  Airway & Oxygen Therapy: Patient Spontanous Breathing and Patient connected to face mask oxygen  Post-op Assessment: Report given to RN and Post -op Vital signs reviewed and stable  Post vital signs: Reviewed and stable  Last Vitals:  Vitals Value Taken Time  BP    Temp    Pulse    Resp    SpO2      Last Pain:  Vitals:   05/09/22 0759  TempSrc: Oral  PainSc: 0-No pain      Patients Stated Pain Goal: 1 ([id number])  Complications: No notable events documented.

## 2022-05-09 NOTE — Op Note (Signed)
Operative Note   DATE OF OPERATION: 2.23.24  LOCATION: [facility]-outpatient  SURGICAL DIVISION: Plastic Surgery  PREOPERATIVE DIAGNOSES:  1. Ectropion left lower eyelid  POSTOPERATIVE DIAGNOSES:  same  PROCEDURE:  Left lateral canthopexy  SURGEON: Irene Limbo MD MBA  ASSISTANT: none  ANESTHESIA:  General.   EBL: minimal  COMPLICATIONS: None immediate.   INDICATIONS FOR PROCEDURE:  The patient, Billy Tran, is a 72 y.o. male born on Feb 28, 1951, is here for treatment left lower eyelid ectropion incurred following surgery.   FINDINGS: Following completion canthopexy, lower eyelid adherent to globe and able to distract lower eyelid from globe < 4 mm.  DESCRIPTION OF PROCEDURE:  The patient's operative site was marked with the patient in the preoperative area. The patient was taken to the operating room. SCDs were placed and IV antibiotics were given. Ophthalmic lubricant and scleral shields placed. The patient's operative site was prepped and draped in a sterile fashion. A time out was performed and all information was confirmed to be correct. Local anesthetic infiltrated to perform left supraorbital and infraorbital nerve blocks. Additional local anesthetic infiltrated in area of planned incision. Incision made in prior left lower eyelid scar lateral extent. Dissection completed to lateral orbital rim. Lateral canthal tendon identified. 4-0 mersilene suture passed through tendon and secured to lateral orbital rim periosteum. Redundant skin excised. Skin closure completed with 5-0 plain gut interrupted sutures. Shields removed. Eyes irrigated with basic salt solution. Tobradex ophthalmic ointment placed in left eye.   The patient was allowed to wake from anesthesia, extubated and taken to the recovery room in satisfactory condition.   SPECIMENS: none  DRAINS: none  Irene Limbo, MD [facility]  Office/ physician access line after hours  [phone]

## 2022-05-09 NOTE — Interval H&P Note (Signed)
History and Physical Interval Note:  05/09/2022 8:36 AM  Billy Tran  has presented today for surgery, with the diagnosis of left lower eyelid ectropion.  The various methods of treatment have been discussed with the patient and family. After consideration of risks, benefits and other options for treatment, the patient has consented to  left canthopexy as a surgical intervention.  The patient's history has been reviewed, patient examined, no change in status, stable for surgery.  I have reviewed the patient's chart and labs.  Questions were answered to the patient's satisfaction.     Arnoldo Hooker Ory Elting

## 2022-05-09 NOTE — Anesthesia Procedure Notes (Signed)
Procedure Name: LMA Insertion Date/Time: 05/09/2022 9:44 AM  Performed by: Willa Frater, CRNAPre-anesthesia Checklist: Patient identified, Emergency Drugs available, Suction available and Patient being monitored Patient Re-evaluated:Patient Re-evaluated prior to induction Oxygen Delivery Method: Circle system utilized Preoxygenation: Pre-oxygenation with 100% oxygen Induction Type: IV induction Ventilation: Mask ventilation without difficulty LMA: LMA flexible inserted LMA Size: 5.0 Number of attempts: 1 Airway Equipment and Method: Bite block Placement Confirmation: positive ETCO2 Tube secured with: Tape Dental Injury: Teeth and Oropharynx as per pre-operative assessment

## 2022-05-09 NOTE — Anesthesia Postprocedure Evaluation (Signed)
Anesthesia Post Note  Patient: Billy Tran  Procedure(s) Performed: LEFT LATERAL CANTHOPLASTY (Left: Eye)     Patient location during evaluation: PACU Anesthesia Type: General Level of consciousness: awake and alert and oriented Pain management: pain level controlled Vital Signs Assessment: post-procedure vital signs reviewed and stable Respiratory status: spontaneous breathing, nonlabored ventilation and respiratory function stable Cardiovascular status: blood pressure returned to baseline and stable Postop Assessment: no apparent nausea or vomiting Anesthetic complications: no   No notable events documented.  Last Vitals:  Vitals:   05/09/22 1045 05/09/22 1100  BP: (!) 138/97 (!) 151/92  Pulse: 65 70  Resp: 13 16  Temp:  (!) 36.2 C  SpO2: 97% 97%    Last Pain:  Vitals:   05/09/22 1100  TempSrc:   PainSc: 0-No pain                 Madiline Saffran A.

## 2022-05-09 NOTE — Anesthesia Preprocedure Evaluation (Addendum)
Anesthesia Evaluation  Patient identified by MRN, date of birth, ID band Patient awake    Reviewed: Allergy & Precautions, NPO status , Patient's Chart, lab work & pertinent test results  Airway Mallampati: II  TM Distance: >3 FB Neck ROM: Full    Dental no notable dental hx. (+) Teeth Intact, Caps, Dental Advisory Given   Pulmonary    Pulmonary exam normal breath sounds clear to auscultation       Cardiovascular negative cardio ROS Normal cardiovascular exam+ Valvular Problems/Murmurs  Rhythm:Regular Rate:Normal     Neuro/Psych  Headaches Ectropion left lower eyelid  negative psych ROS   GI/Hepatic Neg liver ROS,GERD  Medicated,,  Endo/Other  negative endocrine ROS    Renal/GU negative Renal ROS  negative genitourinary   Musculoskeletal  (+) Arthritis , Osteoarthritis,    Abdominal   Peds  Hematology negative hematology ROS (+)   Anesthesia Other Findings   Reproductive/Obstetrics                             Anesthesia Physical Anesthesia Plan  ASA: 2  Anesthesia Plan: General   Post-op Pain Management: Minimal or no pain anticipated   Induction: Intravenous  PONV Risk Score and Plan: 3 and Treatment may vary due to age or medical condition, Ondansetron and Dexamethasone  Airway Management Planned: LMA  Additional Equipment: None  Intra-op Plan:   Post-operative Plan: Extubation in OR  Informed Consent: I have reviewed the patients History and Physical, chart, labs and discussed the procedure including the risks, benefits and alternatives for the proposed anesthesia with the patient or authorized representative who has indicated his/her understanding and acceptance.     Dental advisory given  Plan Discussed with: CRNA and Anesthesiologist  Anesthesia Plan Comments:         Anesthesia Quick Evaluation

## 2022-05-12 ENCOUNTER — Encounter (HOSPITAL_BASED_OUTPATIENT_CLINIC_OR_DEPARTMENT_OTHER): Payer: Self-pay | Admitting: Plastic Surgery
